# Patient Record
Sex: Female | Born: 1980 | Race: White | Hispanic: No | Marital: Married | Smoking: Never smoker
Health system: Southern US, Community
[De-identification: ages and names within clinical notes are randomized; demographics above are authoritative.]

## PROBLEM LIST (undated history)

## (undated) DIAGNOSIS — R51 Headache: Secondary | ICD-10-CM

## (undated) DIAGNOSIS — R12 Heartburn: Secondary | ICD-10-CM

## (undated) DIAGNOSIS — O26899 Other specified pregnancy related conditions, unspecified trimester: Secondary | ICD-10-CM

## (undated) DIAGNOSIS — Z789 Other specified health status: Secondary | ICD-10-CM

---
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
## 2003-02-02 ENCOUNTER — Other Ambulatory Visit: Admission: RE | Admit: 2003-02-02 | Discharge: 2003-02-02 | Payer: Self-pay | Admitting: Obstetrics and Gynecology

## 2003-08-18 ENCOUNTER — Other Ambulatory Visit: Admission: RE | Admit: 2003-08-18 | Discharge: 2003-08-18 | Payer: Self-pay | Admitting: Obstetrics and Gynecology

## 2004-01-12 ENCOUNTER — Other Ambulatory Visit: Admission: RE | Admit: 2004-01-12 | Discharge: 2004-01-12 | Payer: Self-pay | Admitting: Obstetrics and Gynecology

## 2004-05-31 ENCOUNTER — Other Ambulatory Visit: Admission: RE | Admit: 2004-05-31 | Discharge: 2004-05-31 | Payer: Self-pay | Admitting: Obstetrics and Gynecology

## 2004-11-15 ENCOUNTER — Other Ambulatory Visit: Admission: RE | Admit: 2004-11-15 | Discharge: 2004-11-15 | Payer: Self-pay | Admitting: Obstetrics and Gynecology

## 2005-07-07 ENCOUNTER — Emergency Department (HOSPITAL_COMMUNITY): Admission: EM | Admit: 2005-07-07 | Discharge: 2005-07-07 | Payer: Self-pay | Admitting: Family Medicine

## 2005-08-04 ENCOUNTER — Other Ambulatory Visit: Admission: RE | Admit: 2005-08-04 | Discharge: 2005-08-04 | Payer: Self-pay | Admitting: Obstetrics and Gynecology

## 2009-02-06 ENCOUNTER — Inpatient Hospital Stay (HOSPITAL_COMMUNITY): Admission: AD | Admit: 2009-02-06 | Discharge: 2009-02-06 | Payer: Self-pay | Admitting: Obstetrics and Gynecology

## 2009-04-01 ENCOUNTER — Ambulatory Visit (HOSPITAL_COMMUNITY): Admission: RE | Admit: 2009-04-01 | Discharge: 2009-04-01 | Payer: Self-pay | Admitting: Obstetrics and Gynecology

## 2009-05-17 ENCOUNTER — Ambulatory Visit (HOSPITAL_COMMUNITY): Admission: RE | Admit: 2009-05-17 | Discharge: 2009-05-17 | Payer: Self-pay | Admitting: Obstetrics and Gynecology

## 2009-08-26 ENCOUNTER — Inpatient Hospital Stay (HOSPITAL_COMMUNITY): Admission: RE | Admit: 2009-08-26 | Discharge: 2009-08-28 | Payer: Self-pay | Admitting: Obstetrics and Gynecology

## 2009-08-26 ENCOUNTER — Encounter (INDEPENDENT_AMBULATORY_CARE_PROVIDER_SITE_OTHER): Payer: Self-pay | Admitting: Obstetrics and Gynecology

## 2009-09-06 ENCOUNTER — Inpatient Hospital Stay (HOSPITAL_COMMUNITY): Admission: AD | Admit: 2009-09-06 | Discharge: 2009-09-06 | Payer: Self-pay | Admitting: Obstetrics and Gynecology

## 2010-09-12 LAB — CBC
HCT: 31.9 % — ABNORMAL LOW (ref 36.0–46.0)
HCT: 37.3 % (ref 36.0–46.0)
MCHC: 33.5 g/dL (ref 30.0–36.0)
MCV: 95.1 fL (ref 78.0–100.0)
MCV: 95.9 fL (ref 78.0–100.0)
MCV: 95.9 fL (ref 78.0–100.0)
Platelets: 225 10*3/uL (ref 150–400)
Platelets: 304 10*3/uL (ref 150–400)
RBC: 3.33 MIL/uL — ABNORMAL LOW (ref 3.87–5.11)
RBC: 3.89 MIL/uL (ref 3.87–5.11)
RDW: 13.5 % (ref 11.5–15.5)
RDW: 14 % (ref 11.5–15.5)
WBC: 9.4 10*3/uL (ref 4.0–10.5)

## 2010-09-12 LAB — TYPE AND SCREEN: ABO/RH(D): A POS

## 2010-09-12 LAB — ABO/RH: ABO/RH(D): A POS

## 2010-09-12 LAB — RPR: RPR Ser Ql: NONREACTIVE

## 2010-09-24 LAB — GC/CHLAMYDIA PROBE AMP, GENITAL
Chlamydia, DNA Probe: NEGATIVE
GC Probe Amp, Genital: NEGATIVE

## 2010-09-24 LAB — WET PREP, GENITAL

## 2011-05-07 LAB — OB RESULTS CONSOLE ABO/RH

## 2011-05-07 LAB — OB RESULTS CONSOLE GC/CHLAMYDIA: Gonorrhea: NEGATIVE

## 2011-05-07 LAB — OB RESULTS CONSOLE HIV ANTIBODY (ROUTINE TESTING): HIV: NONREACTIVE

## 2011-05-07 LAB — OB RESULTS CONSOLE RUBELLA ANTIBODY, IGM: Rubella: IMMUNE

## 2012-01-24 ENCOUNTER — Encounter (HOSPITAL_COMMUNITY): Payer: Self-pay

## 2012-01-27 ENCOUNTER — Encounter (HOSPITAL_COMMUNITY): Payer: Self-pay | Admitting: Pharmacy Technician

## 2012-01-29 ENCOUNTER — Encounter (HOSPITAL_COMMUNITY)
Admission: RE | Admit: 2012-01-29 | Discharge: 2012-01-29 | Disposition: A | Discharge status: Home / Self Care | Payer: 59 | Source: Ambulatory Visit | Attending: Obstetrics and Gynecology | Admitting: Obstetrics and Gynecology

## 2012-01-29 ENCOUNTER — Encounter (HOSPITAL_COMMUNITY): Payer: Self-pay

## 2012-01-29 HISTORY — DX: Other specified health status: Z78.9

## 2012-01-29 HISTORY — DX: Other specified pregnancy related conditions, unspecified trimester: O26.899

## 2012-01-29 HISTORY — DX: Headache: R51

## 2012-01-29 HISTORY — DX: Heartburn: R12

## 2012-01-29 LAB — CBC
HCT: 37.7 % (ref 36.0–46.0)
Hemoglobin: 12.3 g/dL (ref 12.0–15.0)
MCH: 29.8 pg (ref 26.0–34.0)
Platelets: 181 10*3/uL (ref 150–400)

## 2012-01-29 LAB — SURGICAL PCR SCREEN: Staphylococcus aureus: NEGATIVE

## 2012-01-29 LAB — TYPE AND SCREEN: Antibody Screen: NEGATIVE

## 2012-01-29 NOTE — Patient Instructions (Signed)
Your procedure is scheduled on:02/09/12  Enter through the Main Entrance at :6am Pick up desk phone and dial [phone] and inform us of your arrival.  Please call [phone] if you have any problems the morning of surgery.  Remember: Do not eat after midnight:Thursday Do not drink after:3:30 am Friday- water only  Take these meds the morning of surgery with a sip of water:Prevacid  DO NOT wear jewelry, eye make-up, lipstick,body lotion, or dark fingernail polish. Do not shave for 48 hours prior to surgery.  If you are to be admitted after surgery, leave suitcase in car until your room has been assigned. Patients discharged on the day of surgery will not be allowed to drive home.   Remember to use your Hibiclens as instructed.

## 2012-01-30 LAB — RPR: RPR Ser Ql: NONREACTIVE

## 2012-02-08 NOTE — H&P (Addendum)
Gabrielle Morales is a 31 y.o. female presenting for Repeat C/S and BTL.  Pregnancy has been uncomplicated and she desires repeat with steriliztion History OB History    Grav Para Term Preterm Abortions TAB SAB Ect Mult Living   2         1     Past Medical History  Diagnosis Date  . Heartburn in pregnancy   . Headache     from allergies    . No pertinent past medical history    Past Surgical History  Procedure Date  . Cesarean section    Family History: family history is not on file. Social History:  reports that she has never smoked. She does not have any smokeless tobacco history on file. She reports that she does not drink alcohol or use illicit drugs.   Prenatal Transfer Tool  Maternal Diabetes: No Genetic Screening: Normal Maternal Ultrasounds/Referrals: Normal Fetal Ultrasounds or other Referrals:  None Maternal Substance Abuse:  No Significant Maternal Medications:  None Significant Maternal Lab Results:  None Other Comments:  None  ROS    Last menstrual period 05/08/2011. Exam Physical Exam  Abd Gravid Nt Cx Cl/th/high  Prenatal labs: ABO, Rh: --/--/A POS (08/12 1219) Antibody: NEG (08/12 1219) Rubella: Immune (11/18 0000) RPR: NON REACTIVE (08/12 1219)  HBsAg: Negative (11/18 0000)  HIV: Non-reactive (11/18 0000)  GBS:     Assessment/Plan: IUP at 39 weeks with prev CS for repeat and desires sterilation Risks and benefits of C/S were discussed.  All questions were answered and informed consent was obtained.  Plan to proceed with low segment transverse Cesarean Section. BTL failure rate discussed and she gives informed consent and wishes to procede DL   Vastie Douty C 1/61/0960, 6:46 PM  This patient has been seen and examined.   All of her questions were answered.  Labs and vital signs reviewed.  Informed consent has been obtained.  The History and Physical is current.  02/09/12 0715 DL

## 2012-02-09 ENCOUNTER — Encounter (HOSPITAL_COMMUNITY): Payer: Self-pay | Admitting: Anesthesiology

## 2012-02-09 ENCOUNTER — Encounter (HOSPITAL_COMMUNITY)
Admission: RE | Disposition: A | Discharge status: Home / Self Care | Payer: Self-pay | Source: Ambulatory Visit | Attending: Obstetrics and Gynecology

## 2012-02-09 ENCOUNTER — Encounter (HOSPITAL_COMMUNITY): Payer: Self-pay | Admitting: *Deleted

## 2012-02-09 ENCOUNTER — Inpatient Hospital Stay (HOSPITAL_COMMUNITY): Payer: 59 | Admitting: Anesthesiology

## 2012-02-09 ENCOUNTER — Inpatient Hospital Stay (HOSPITAL_COMMUNITY)
Admission: RE | Admit: 2012-02-09 | Discharge: 2012-02-11 | DRG: 766 | Disposition: A | Discharge status: Home / Self Care | Payer: 59 | Source: Ambulatory Visit | Attending: Obstetrics and Gynecology | Admitting: Obstetrics and Gynecology

## 2012-02-09 DIAGNOSIS — O34219 Maternal care for unspecified type scar from previous cesarean delivery: Principal | ICD-10-CM | POA: Diagnosis present

## 2012-02-09 DIAGNOSIS — Z302 Encounter for sterilization: Secondary | ICD-10-CM

## 2012-02-09 LAB — TYPE AND SCREEN: Antibody Screen: NEGATIVE

## 2012-02-09 SURGERY — Surgical Case
Anesthesia: Spinal | Site: Abdomen | Laterality: Bilateral | Wound class: Clean Contaminated

## 2012-02-09 MED ORDER — IBUPROFEN 600 MG PO TABS
600.0000 mg | ORAL_TABLET | Freq: Four times a day (QID) | ORAL | Status: DC | PRN
  Administered 2012-02-11: 600 mg via ORAL
  Filled 2012-02-09 (×3): qty 1

## 2012-02-09 MED ORDER — SODIUM CHLORIDE 0.9 % IV SOLN
1.0000 ug/kg/h | INTRAVENOUS | Status: DC | PRN
  Filled 2012-02-09: qty 2.5

## 2012-02-09 MED ORDER — ONDANSETRON HCL 4 MG PO TABS: 4.0000 mg | ORAL_TABLET | ORAL | Status: DC | PRN

## 2012-02-09 MED ORDER — OXYTOCIN 10 UNIT/ML IJ SOLN
INTRAMUSCULAR | Status: AC
  Filled 2012-02-09: qty 4

## 2012-02-09 MED ORDER — SCOPOLAMINE 1 MG/3DAYS TD PT72
1.0000 | MEDICATED_PATCH | Freq: Once | TRANSDERMAL | Status: DC
  Administered 2012-02-09: 1.5 mg via TRANSDERMAL

## 2012-02-09 MED ORDER — ONDANSETRON HCL 4 MG/2ML IJ SOLN: 4.0000 mg | Freq: Three times a day (TID) | INTRAMUSCULAR | Status: DC | PRN

## 2012-02-09 MED ORDER — PRENATAL MULTIVITAMIN CH: 1.0000 | ORAL_TABLET | Freq: Every day | ORAL | Status: DC

## 2012-02-09 MED ORDER — NALBUPHINE HCL 10 MG/ML IJ SOLN
5.0000 mg | INTRAMUSCULAR | Status: DC | PRN
  Filled 2012-02-09 (×2): qty 1

## 2012-02-09 MED ORDER — CEFOTETAN DISODIUM 2 G IJ SOLR
2.0000 g | INTRAMUSCULAR | Status: AC
  Administered 2012-02-09: 2 g via INTRAVENOUS
  Filled 2012-02-09: qty 2

## 2012-02-09 MED ORDER — LACTATED RINGERS IV SOLN
INTRAVENOUS | Status: DC
  Administered 2012-02-09 (×2): via INTRAVENOUS

## 2012-02-09 MED ORDER — KETOROLAC TROMETHAMINE 60 MG/2ML IM SOLN
60.0000 mg | Freq: Once | INTRAMUSCULAR | Status: AC | PRN
  Administered 2012-02-09: 60 mg via INTRAMUSCULAR

## 2012-02-09 MED ORDER — SCOPOLAMINE 1 MG/3DAYS TD PT72
MEDICATED_PATCH | TRANSDERMAL | Status: AC
  Administered 2012-02-09: 1.5 mg via TRANSDERMAL
  Filled 2012-02-09: qty 1

## 2012-02-09 MED ORDER — MENTHOL 3 MG MT LOZG: 1.0000 | LOZENGE | OROMUCOSAL | Status: DC | PRN

## 2012-02-09 MED ORDER — PHENYLEPHRINE 40 MCG/ML (10ML) SYRINGE FOR IV PUSH (FOR BLOOD PRESSURE SUPPORT)
PREFILLED_SYRINGE | INTRAVENOUS | Status: AC
  Filled 2012-02-09: qty 5

## 2012-02-09 MED ORDER — ONDANSETRON HCL 4 MG/2ML IJ SOLN
INTRAMUSCULAR | Status: AC
  Filled 2012-02-09: qty 2

## 2012-02-09 MED ORDER — NALBUPHINE HCL 10 MG/ML IJ SOLN
5.0000 mg | INTRAMUSCULAR | Status: DC | PRN
  Administered 2012-02-09: 5 mg via INTRAVENOUS
  Filled 2012-02-09: qty 1

## 2012-02-09 MED ORDER — DIPHENHYDRAMINE HCL 25 MG PO CAPS: 25.0000 mg | ORAL_CAPSULE | Freq: Four times a day (QID) | ORAL | Status: DC | PRN

## 2012-02-09 MED ORDER — PRENATAL MULTIVITAMIN CH
1.0000 | ORAL_TABLET | Freq: Every day | ORAL | Status: DC
  Administered 2012-02-10 – 2012-02-11 (×2): 1 via ORAL
  Filled 2012-02-09 (×2): qty 1

## 2012-02-09 MED ORDER — OXYTOCIN 40 UNITS IN LACTATED RINGERS INFUSION - SIMPLE MED: 62.5000 mL/h | INTRAVENOUS | Status: AC

## 2012-02-09 MED ORDER — LACTATED RINGERS IV SOLN
INTRAVENOUS | Status: DC
  Administered 2012-02-09: 12:00:00 via INTRAVENOUS

## 2012-02-09 MED ORDER — PHENYLEPHRINE HCL 10 MG/ML IJ SOLN
INTRAMUSCULAR | Status: DC | PRN
  Administered 2012-02-09 (×8): 40 ug via INTRAVENOUS

## 2012-02-09 MED ORDER — EPHEDRINE 5 MG/ML INJ
INTRAVENOUS | Status: AC
  Filled 2012-02-09: qty 10

## 2012-02-09 MED ORDER — MORPHINE SULFATE (PF) 0.5 MG/ML IJ SOLN
INTRAMUSCULAR | Status: DC | PRN
  Administered 2012-02-09: .1 mg via INTRATHECAL

## 2012-02-09 MED ORDER — ONDANSETRON HCL 4 MG/2ML IJ SOLN
INTRAMUSCULAR | Status: DC | PRN
  Administered 2012-02-09: 4 mg via INTRAVENOUS

## 2012-02-09 MED ORDER — SIMETHICONE 80 MG PO CHEW: 80.0000 mg | CHEWABLE_TABLET | ORAL | Status: DC | PRN

## 2012-02-09 MED ORDER — FENTANYL CITRATE 0.05 MG/ML IJ SOLN: 25.0000 ug | INTRAMUSCULAR | Status: DC | PRN

## 2012-02-09 MED ORDER — ONDANSETRON HCL 4 MG/2ML IJ SOLN: 4.0000 mg | INTRAMUSCULAR | Status: DC | PRN

## 2012-02-09 MED ORDER — METOCLOPRAMIDE HCL 5 MG/ML IJ SOLN: 10.0000 mg | Freq: Three times a day (TID) | INTRAMUSCULAR | Status: DC | PRN

## 2012-02-09 MED ORDER — SIMETHICONE 80 MG PO CHEW
80.0000 mg | CHEWABLE_TABLET | Freq: Three times a day (TID) | ORAL | Status: DC
  Administered 2012-02-09 – 2012-02-11 (×6): 80 mg via ORAL

## 2012-02-09 MED ORDER — ZOLPIDEM TARTRATE 5 MG PO TABS: 5.0000 mg | ORAL_TABLET | Freq: Every evening | ORAL | Status: DC | PRN

## 2012-02-09 MED ORDER — DIPHENHYDRAMINE HCL 25 MG PO CAPS
25.0000 mg | ORAL_CAPSULE | ORAL | Status: DC | PRN
  Filled 2012-02-09: qty 1

## 2012-02-09 MED ORDER — WITCH HAZEL-GLYCERIN EX PADS: 1.0000 "application " | MEDICATED_PAD | CUTANEOUS | Status: DC | PRN

## 2012-02-09 MED ORDER — HYDROMORPHONE HCL PF 1 MG/ML IJ SOLN
0.5000 mg | Freq: Once | INTRAMUSCULAR | Status: AC
  Administered 2012-02-09: 0.5 mg via INTRAVENOUS
  Filled 2012-02-09: qty 1

## 2012-02-09 MED ORDER — BUPIVACAINE IN DEXTROSE 0.75-8.25 % IT SOLN
INTRATHECAL | Status: DC | PRN
  Administered 2012-02-09: 1.5 mL via INTRATHECAL

## 2012-02-09 MED ORDER — SCOPOLAMINE 1 MG/3DAYS TD PT72: 1.0000 | MEDICATED_PATCH | Freq: Once | TRANSDERMAL | Status: DC

## 2012-02-09 MED ORDER — TETANUS-DIPHTH-ACELL PERTUSSIS 5-2.5-18.5 LF-MCG/0.5 IM SUSP: 0.5000 mL | Freq: Once | INTRAMUSCULAR | Status: DC

## 2012-02-09 MED ORDER — LACTATED RINGERS IV SOLN
INTRAVENOUS | Status: DC | PRN
  Administered 2012-02-09: 08:00:00 via INTRAVENOUS

## 2012-02-09 MED ORDER — DIBUCAINE 1 % RE OINT: 1.0000 "application " | TOPICAL_OINTMENT | RECTAL | Status: DC | PRN

## 2012-02-09 MED ORDER — CEFAZOLIN SODIUM-DEXTROSE 2-3 GM-% IV SOLR
INTRAVENOUS | Status: AC
  Filled 2012-02-09: qty 50

## 2012-02-09 MED ORDER — MORPHINE SULFATE 0.5 MG/ML IJ SOLN
INTRAMUSCULAR | Status: AC
  Filled 2012-02-09: qty 10

## 2012-02-09 MED ORDER — KETOROLAC TROMETHAMINE 60 MG/2ML IM SOLN
INTRAMUSCULAR | Status: AC
  Filled 2012-02-09: qty 2

## 2012-02-09 MED ORDER — OXYCODONE-ACETAMINOPHEN 5-325 MG PO TABS
1.0000 | ORAL_TABLET | ORAL | Status: DC | PRN
  Administered 2012-02-09 – 2012-02-11 (×4): 1 via ORAL
  Filled 2012-02-09 (×4): qty 1

## 2012-02-09 MED ORDER — FENTANYL CITRATE 0.05 MG/ML IJ SOLN
INTRAMUSCULAR | Status: AC
  Filled 2012-02-09: qty 2

## 2012-02-09 MED ORDER — SODIUM CHLORIDE 0.9 % IJ SOLN: 3.0000 mL | INTRAMUSCULAR | Status: DC | PRN

## 2012-02-09 MED ORDER — LANOLIN HYDROUS EX OINT: 1.0000 "application " | TOPICAL_OINTMENT | CUTANEOUS | Status: DC | PRN

## 2012-02-09 MED ORDER — MEPERIDINE HCL 25 MG/ML IJ SOLN: 6.2500 mg | INTRAMUSCULAR | Status: DC | PRN

## 2012-02-09 MED ORDER — FENTANYL CITRATE 0.05 MG/ML IJ SOLN
INTRAMUSCULAR | Status: DC | PRN
  Administered 2012-02-09: 15 ug via INTRATHECAL
  Administered 2012-02-09: 15 ug via INTRAVENOUS

## 2012-02-09 MED ORDER — IBUPROFEN 600 MG PO TABS
600.0000 mg | ORAL_TABLET | Freq: Four times a day (QID) | ORAL | Status: DC
  Administered 2012-02-09 – 2012-02-11 (×6): 600 mg via ORAL
  Filled 2012-02-09 (×4): qty 1

## 2012-02-09 MED ORDER — KETOROLAC TROMETHAMINE 30 MG/ML IJ SOLN: 30.0000 mg | Freq: Four times a day (QID) | INTRAMUSCULAR | Status: AC | PRN

## 2012-02-09 MED ORDER — SENNOSIDES-DOCUSATE SODIUM 8.6-50 MG PO TABS
2.0000 | ORAL_TABLET | Freq: Every day | ORAL | Status: DC
  Administered 2012-02-09 – 2012-02-10 (×2): 2 via ORAL

## 2012-02-09 MED ORDER — NALOXONE HCL 0.4 MG/ML IJ SOLN: 0.4000 mg | INTRAMUSCULAR | Status: DC | PRN

## 2012-02-09 MED ORDER — OXYTOCIN 40 UNITS IN LACTATED RINGERS INFUSION - SIMPLE MED
INTRAVENOUS | Status: DC | PRN
  Administered 2012-02-09: 40 [IU] via INTRAVENOUS

## 2012-02-09 MED ORDER — DIPHENHYDRAMINE HCL 50 MG/ML IJ SOLN: 12.5000 mg | INTRAMUSCULAR | Status: DC | PRN

## 2012-02-09 MED ORDER — DIPHENHYDRAMINE HCL 50 MG/ML IJ SOLN: 25.0000 mg | INTRAMUSCULAR | Status: DC | PRN

## 2012-02-09 SURGICAL SUPPLY — 31 items
CLIP FILSHIE TUBAL LIGA STRL (Clip) ×2 IMPLANT
CLOTH BEACON ORANGE TIMEOUT ST (SAFETY) ×2 IMPLANT
DRESSING TELFA 8X3 (GAUZE/BANDAGES/DRESSINGS) IMPLANT
DRSG COVADERM 4X10 (GAUZE/BANDAGES/DRESSINGS) ×4 IMPLANT
ELECT REM PT RETURN 9FT ADLT (ELECTROSURGICAL) ×2
ELECTRODE REM PT RTRN 9FT ADLT (ELECTROSURGICAL) ×1 IMPLANT
EXTRACTOR VACUUM M CUP 4 TUBE (SUCTIONS) IMPLANT
GAUZE SPONGE 4X4 12PLY STRL LF (GAUZE/BANDAGES/DRESSINGS) IMPLANT
GLOVE BIO SURGEON STRL SZ8 (GLOVE) ×2 IMPLANT
GLOVE BIOGEL PI IND STRL 7.5 (GLOVE) ×1 IMPLANT
GLOVE BIOGEL PI INDICATOR 7.5 (GLOVE) ×1
GLOVE SURG ORTHO 8.0 STRL STRW (GLOVE) ×2 IMPLANT
GLOVE SURG SS PI 7.5 STRL IVOR (GLOVE) ×2 IMPLANT
GOWN PREVENTION PLUS LG XLONG (DISPOSABLE) ×4 IMPLANT
KIT ABG SYR 3ML LUER SLIP (SYRINGE) ×2 IMPLANT
NEEDLE HYPO 25X5/8 SAFETYGLIDE (NEEDLE) ×2 IMPLANT
NS IRRIG 1000ML POUR BTL (IV SOLUTION) ×2 IMPLANT
PACK C SECTION WH (CUSTOM PROCEDURE TRAY) ×2 IMPLANT
PAD ABD 7.5X8 STRL (GAUZE/BANDAGES/DRESSINGS) IMPLANT
PAD OB MATERNITY 4.3X12.25 (PERSONAL CARE ITEMS) ×2 IMPLANT
SLEEVE SCD COMPRESS KNEE MED (MISCELLANEOUS) IMPLANT
STAPLER VISISTAT 35W (STAPLE) ×2 IMPLANT
SUT MNCRL 0 VIOLET CTX 36 (SUTURE) ×3 IMPLANT
SUT MONOCRYL 0 CTX 36 (SUTURE) ×3
SUT PDS AB 1 CT  36 (SUTURE)
SUT PDS AB 1 CT 36 (SUTURE) IMPLANT
SUT VIC AB 1 CTX 36 (SUTURE)
SUT VIC AB 1 CTX36XBRD ANBCTRL (SUTURE) IMPLANT
TOWEL OR 17X24 6PK STRL BLUE (TOWEL DISPOSABLE) ×4 IMPLANT
TRAY FOLEY CATH 14FR (SET/KITS/TRAYS/PACK) ×2 IMPLANT
WATER STERILE IRR 1000ML POUR (IV SOLUTION) IMPLANT

## 2012-02-09 NOTE — Addendum Note (Signed)
Addendum  created 02/09/12 1718 by Algis Greenhouse, CRNA   Modules edited:Notes Section

## 2012-02-09 NOTE — Transfer of Care (Signed)
Immediate Anesthesia Transfer of Care Note  Patient: Gabrielle Morales  Procedure(s) Performed: Procedure(s) (LRB): CESAREAN SECTION WITH BILATERAL TUBAL LIGATION (Bilateral)  Patient Location: PACU  Anesthesia Type: Spinal  Level of Consciousness: awake, alert  and oriented  Airway & Oxygen Therapy: Patient Spontanous Breathing  Post-op Assessment: Report given to PACU RN and Post -op Vital signs reviewed and stable  Post vital signs: Reviewed and stable  Complications: No apparent anesthesia complications

## 2012-02-09 NOTE — Anesthesia Postprocedure Evaluation (Signed)
Anesthesia Post Note  Patient: Gabrielle Morales  Procedure(s) Performed: Procedure(s) (LRB): CESAREAN SECTION WITH BILATERAL TUBAL LIGATION (Bilateral)  Anesthesia type: Spinal  Patient location: PACU  Post pain: Pain level controlled  Post assessment: Post-op Vital signs reviewed  Last Vitals:  Filed Vitals:   02/09/12 0930  BP: 105/62  Pulse: 74  Temp: 36.4 C  Resp: 17    Post vital signs: Reviewed  Level of consciousness: awake  Complications: No apparent anesthesia complications

## 2012-02-09 NOTE — Anesthesia Postprocedure Evaluation (Signed)
  Anesthesia Post Note  Patient: Gabrielle Morales  Procedure(s) Performed: Procedure(s) (LRB): CESAREAN SECTION WITH BILATERAL TUBAL LIGATION (Bilateral)  Anesthesia type: Spinal  Patient location: Mother/Baby  Post pain: Pain level controlled  Post assessment: Post-op Vital signs reviewed  Last Vitals:  Filed Vitals:   02/09/12 1552  BP: 100/64  Pulse: 89  Temp: 36.7 C  Resp: 18    Post vital signs: Reviewed  Level of consciousness: awake  Complications: No apparent anesthesia complications

## 2012-02-09 NOTE — Anesthesia Preprocedure Evaluation (Signed)
Anesthesia Evaluation  Patient identified by MRN, date of birth, ID band Patient awake    Reviewed: Allergy & Precautions, H&P , NPO status , Patient's Chart, lab work & pertinent test results  Airway Mallampati: I TM Distance: >3 FB Neck ROM: full    Dental No notable dental hx.    Pulmonary neg pulmonary ROS,  breath sounds clear to auscultation  Pulmonary exam normal       Cardiovascular negative cardio ROS      Neuro/Psych negative psych ROS   GI/Hepatic negative GI ROS, Neg liver ROS,   Endo/Other  negative endocrine ROS  Renal/GU negative Renal ROS  negative genitourinary   Musculoskeletal negative musculoskeletal ROS (+)   Abdominal Normal abdominal exam  (+)   Peds negative pediatric ROS (+)  Hematology negative hematology ROS (+)   Anesthesia Other Findings   Reproductive/Obstetrics (+) Pregnancy                           Anesthesia Physical Anesthesia Plan  ASA: II  Anesthesia Plan: Spinal   Post-op Pain Management:    Induction:   Airway Management Planned:   Additional Equipment:   Intra-op Plan:   Post-operative Plan:   Informed Consent: I have reviewed the patients History and Physical, chart, labs and discussed the procedure including the risks, benefits and alternatives for the proposed anesthesia with the patient or authorized representative who has indicated his/her understanding and acceptance.     Plan Discussed with: Surgeon and CRNA  Anesthesia Plan Comments:         Anesthesia Quick Evaluation

## 2012-02-09 NOTE — Op Note (Signed)
Cesarean Section Procedure Note  Pre-operative Diagnosis: IUP at 39 weeks, prev C/S desires repeat, Multiparity desires sterility  Post-operative Diagnosis: same  Surgeon: Turner Daniels   Assistants: none  Anesthesia:Spinal  Procedure:  Low Segment Transverse cesarean section, BTL with Filshie clips  Procedure Details  The patient was seen in the Holding Room. The risks, benefits, complications, treatment options, and expected outcomes were discussed with the patient.  The patient concurred with the proposed plan, giving informed consent.  The site of surgery properly noted/marked.. A Time Out was held and the above information confirmed.  After induction of anesthesia, the patient was draped and prepped in the usual sterile manner. A Pfannenstiel incision was made and carried down through the subcutaneous tissue to the fascia. Fascial incision was made and extended transversely. The fascia was separated from the underlying rectus tissue superiorly and inferiorly. The peritoneum was identified and entered. Peritoneal incision was extended longitudinally. The utero-vesical peritoneal reflection was incised transversely and the bladder flap was bluntly freed from the lower uterine segment. A low transverse uterine incision was made. Delivered from Vertex presentation was a baby with Apgar scores of 9 at one minute and 9 at five minutes. After the umbilical cord was clamped and cut cord blood was obtained for evaluation. The placenta was removed intact and appeared normal. The uterine outline, tubes and ovaries appeared normal. The uterine incision was closed with running locked sutures of 0 monocryl and imbricated with 0 monocryl. Hemostasis was observed. Lavage was carried out until clear.  Both fallopian tubes were identified by their fimbriated ends and a filshie clip was placed at the mid portion of the fallopian tubes with good placement noted across the entire tubes.   The peritoneum was then  closed with 0 monocryl and rectus muscles plicated in the midline.  After hemostasis was assured, the fascia was then reapproximated with running sutures of 0 Vicryl. Irrigation was applied and after adequate hemostasis was assured, the skin was reapproximated with staples.  Instrument, sponge, and needle counts were correct prior the abdominal closure and at the conclusion of the case. The patient received 2 grams cefotetan preoperatively.  Findings: Viable female, ph art pending  Estimated Blood Loss:  400cc         Specimens: Placenta was sent to L&D, CCB done         Complications:  None

## 2012-02-09 NOTE — Anesthesia Procedure Notes (Signed)
Spinal  Patient location during procedure: OR Start time: 02/09/2012 7:36 AM Staffing Performed by: anesthesiologist  Preanesthetic Checklist Completed: patient identified, site marked, surgical consent, pre-op evaluation, timeout performed, IV checked, risks and benefits discussed and monitors and equipment checked Spinal Block Patient position: sitting Prep: site prepped and draped and DuraPrep Patient monitoring: heart rate, cardiac monitor, continuous pulse ox and blood pressure Approach: midline Location: L3-4 Injection technique: single-shot Needle Needle type: Sprotte  Needle gauge: 24 G Needle length: 9 cm Assessment Sensory level: T4 Additional Notes Clear free flow CSF on first attempt.  Transient right paresthesia.  Patient tolerated procedure well.  Jasmine December, MD

## 2012-02-10 LAB — CBC
MCH: 30.1 pg (ref 26.0–34.0)
MCHC: 32.7 g/dL (ref 30.0–36.0)
MCV: 92 fL (ref 78.0–100.0)
Platelets: 174 10*3/uL (ref 150–400)
RDW: 13.7 % (ref 11.5–15.5)

## 2012-02-10 NOTE — Progress Notes (Signed)
Subjective: Postpartum Day 1: Cesarean Delivery Patient reports tolerating PO, + flatus, + BM and no problems voiding.  Requests circumcision today.  Objective: Vital signs in last 24 hours: Temp:  [97.2 F (36.2 C)-98.9 F (37.2 C)] 97.4 F (36.3 C) (08/24 0555) Pulse Rate:  [51-99] 79  (08/24 0555) Resp:  [13-28] 18  (08/24 0555) BP: (90-113)/(54-73) 104/59 mmHg (08/24 0555) SpO2:  [96 %-99 %] 98 % (08/24 0100)  Physical Exam:  General: alert, cooperative and appears stated age Lochia: appropriate Uterine Fundus: firm Incision: healing well, no significant drainage.  Staples in place. DVT Evaluation: No evidence of DVT seen on physical exam. Negative Homan's sign. No cords or calf tenderness. No significant calf/ankle edema.   Basename 02/10/12 0500  HGB 10.1*  HCT 30.9*    Assessment/Plan: Status post Cesarean section. Doing well postoperatively.  Continue current care. Requests early discharge tomorrow. Baby for circ this AM.  Jull Harral 02/10/2012, 8:43 AM

## 2012-02-11 MED ORDER — IBUPROFEN 600 MG PO TABS: 600.0000 mg | ORAL_TABLET | Freq: Four times a day (QID) | ORAL | Status: AC | PRN

## 2012-02-11 MED ORDER — OXYCODONE-ACETAMINOPHEN 5-325 MG PO TABS: 1.0000 | ORAL_TABLET | ORAL | Status: AC | PRN

## 2012-02-11 NOTE — Progress Notes (Signed)
Pt requesting infant to receive bottle at this time. Nursery made aware.

## 2012-02-11 NOTE — Discharge Summary (Signed)
Obstetric Discharge Summary Reason for Admission: cesarean section.  Requests early discharge.  Circumcision performed yesterday. Prenatal Procedures: none Intrapartum Procedures: cesarean: low cervical, transverse Postpartum Procedures: none Complications-Operative and Postpartum: none Hemoglobin  Date Value Range Status  02/10/2012 10.1* 12.0 - 15.0 g/dL Final     HCT  Date Value Range Status  02/10/2012 30.9* 36.0 - 46.0 % Final    Physical Exam:  General: alert, cooperative and appears stated age 31: appropriate Uterine Fundus: firm Incision: healing well, no significant drainage, no dehiscence, no significant erythema DVT Evaluation: No evidence of DVT seen on physical exam. Negative Homan's sign. No cords or calf tenderness. No significant calf/ankle edema.  Discharge Diagnoses: Term Pregnancy-delivered  Discharge Information: Date: 02/11/2012 Activity: unrestricted and pelvic rest Diet: routine Medications: PNV, Ibuprofen, Colace and Percocet Condition: stable Instructions: refer to practice specific booklet Discharge to: home   Newborn Data: Live born female  Birth Weight: 8 lb 8.7 oz (3875 g) APGAR: 9, 9  Home with mother.  Gabrielle Morales 02/11/2012, 9:24 AM

## 2012-02-16 ENCOUNTER — Inpatient Hospital Stay (HOSPITAL_COMMUNITY): Admission: AD | Admit: 2012-02-16 | Payer: Self-pay | Source: Ambulatory Visit | Admitting: Obstetrics and Gynecology

## 2013-06-18 ENCOUNTER — Other Ambulatory Visit (HOSPITAL_COMMUNITY): Payer: Self-pay | Admitting: Emergency Medicine

## 2013-06-18 ENCOUNTER — Ambulatory Visit (HOSPITAL_COMMUNITY)
Admission: RE | Admit: 2013-06-18 | Discharge: 2013-06-18 | Disposition: A | Discharge status: Home / Self Care | Payer: 59 | Source: Ambulatory Visit | Attending: Emergency Medicine | Admitting: Emergency Medicine

## 2013-06-18 DIAGNOSIS — R059 Cough, unspecified: Secondary | ICD-10-CM | POA: Insufficient documentation

## 2013-06-18 DIAGNOSIS — R52 Pain, unspecified: Secondary | ICD-10-CM

## 2013-06-18 DIAGNOSIS — R05 Cough: Secondary | ICD-10-CM | POA: Insufficient documentation

## 2013-06-18 DIAGNOSIS — R0789 Other chest pain: Secondary | ICD-10-CM | POA: Insufficient documentation

## 2014-04-20 ENCOUNTER — Encounter (HOSPITAL_COMMUNITY): Payer: Self-pay | Admitting: *Deleted

## 2014-04-24 ENCOUNTER — Other Ambulatory Visit: Payer: Self-pay | Admitting: Obstetrics and Gynecology

## 2014-04-24 DIAGNOSIS — N644 Mastodynia: Secondary | ICD-10-CM

## 2014-05-06 ENCOUNTER — Other Ambulatory Visit: Payer: 59

## 2014-05-07 ENCOUNTER — Ambulatory Visit
Admission: RE | Admit: 2014-05-07 | Discharge: 2014-05-07 | Disposition: A | Discharge status: Home / Self Care | Payer: 59 | Source: Ambulatory Visit | Attending: Obstetrics and Gynecology | Admitting: Obstetrics and Gynecology

## 2014-05-07 DIAGNOSIS — N644 Mastodynia: Secondary | ICD-10-CM

## 2014-05-26 ENCOUNTER — Other Ambulatory Visit: Payer: Self-pay | Admitting: Obstetrics and Gynecology

## 2014-05-27 LAB — CYTOLOGY - PAP

## 2015-01-15 ENCOUNTER — Other Ambulatory Visit: Payer: Self-pay | Admitting: Obstetrics and Gynecology

## 2015-06-29 DIAGNOSIS — Z01419 Encounter for gynecological examination (general) (routine) without abnormal findings: Secondary | ICD-10-CM | POA: Diagnosis not present

## 2015-06-29 DIAGNOSIS — Z6825 Body mass index (BMI) 25.0-25.9, adult: Secondary | ICD-10-CM | POA: Diagnosis not present

## 2015-07-02 DIAGNOSIS — Z13228 Encounter for screening for other metabolic disorders: Secondary | ICD-10-CM | POA: Diagnosis not present

## 2015-07-02 DIAGNOSIS — Z1329 Encounter for screening for other suspected endocrine disorder: Secondary | ICD-10-CM | POA: Diagnosis not present

## 2015-07-02 DIAGNOSIS — Z13220 Encounter for screening for lipoid disorders: Secondary | ICD-10-CM | POA: Diagnosis not present

## 2015-07-02 DIAGNOSIS — Z131 Encounter for screening for diabetes mellitus: Secondary | ICD-10-CM | POA: Diagnosis not present

## 2015-07-02 DIAGNOSIS — Z1321 Encounter for screening for nutritional disorder: Secondary | ICD-10-CM | POA: Diagnosis not present

## 2016-06-29 DIAGNOSIS — Z01419 Encounter for gynecological examination (general) (routine) without abnormal findings: Secondary | ICD-10-CM | POA: Diagnosis not present

## 2016-06-29 DIAGNOSIS — Z6825 Body mass index (BMI) 25.0-25.9, adult: Secondary | ICD-10-CM | POA: Diagnosis not present

## 2016-11-18 IMAGING — MG MM DIAGNOSTIC BILATERAL
4 series · 4 of 4 positions shown · non-contrast
Comparison: None.

CLINICAL DATA: Left breast pain

EXAM:
DIGITAL DIAGNOSTIC  BILATERAL MAMMOGRAM WITH CAD
ULTRASOUND BILATERAL BREAST

[R CC]
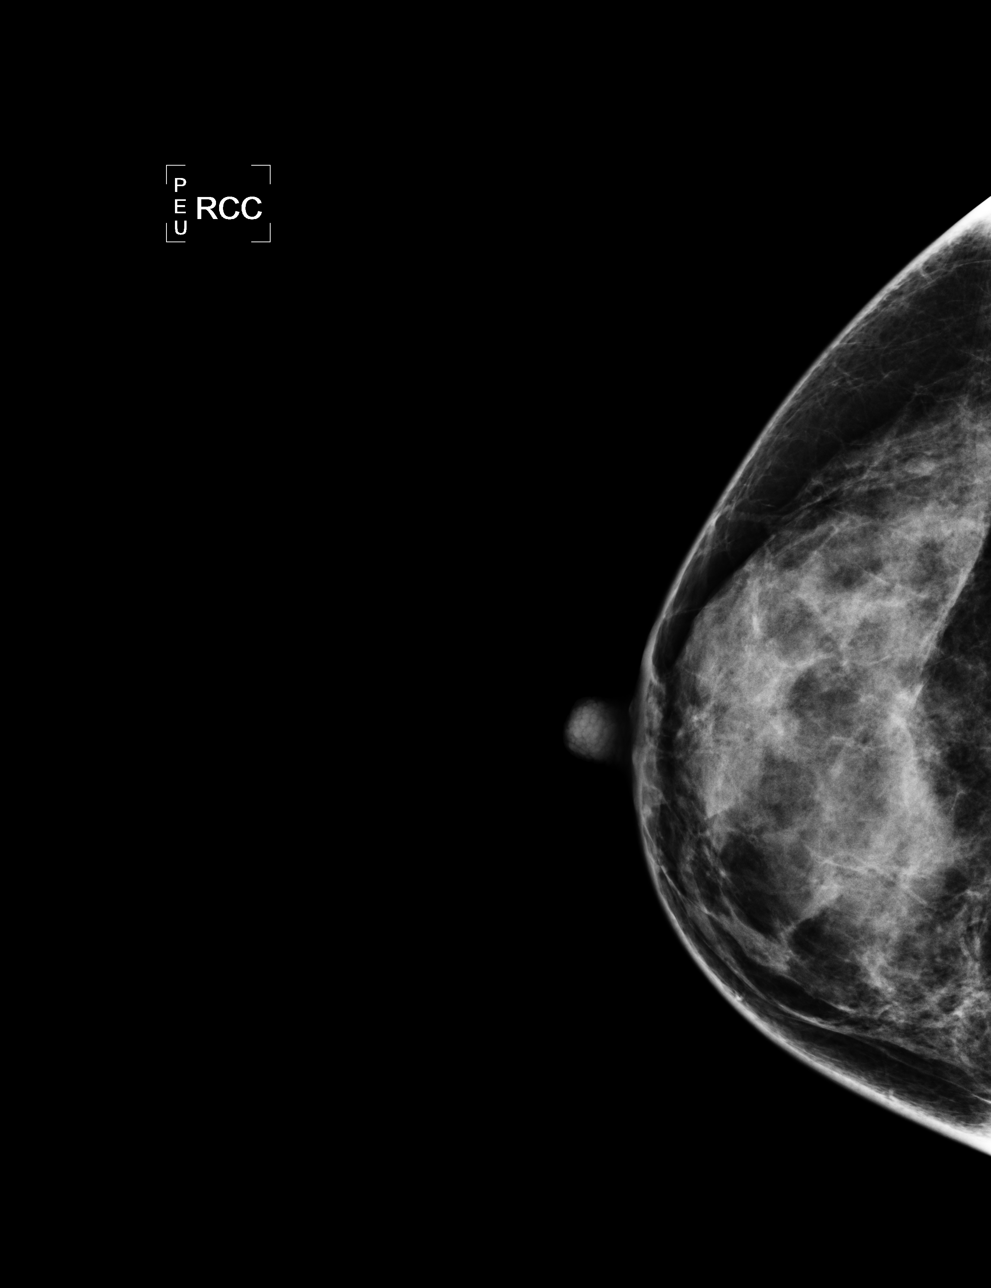

[L CC]
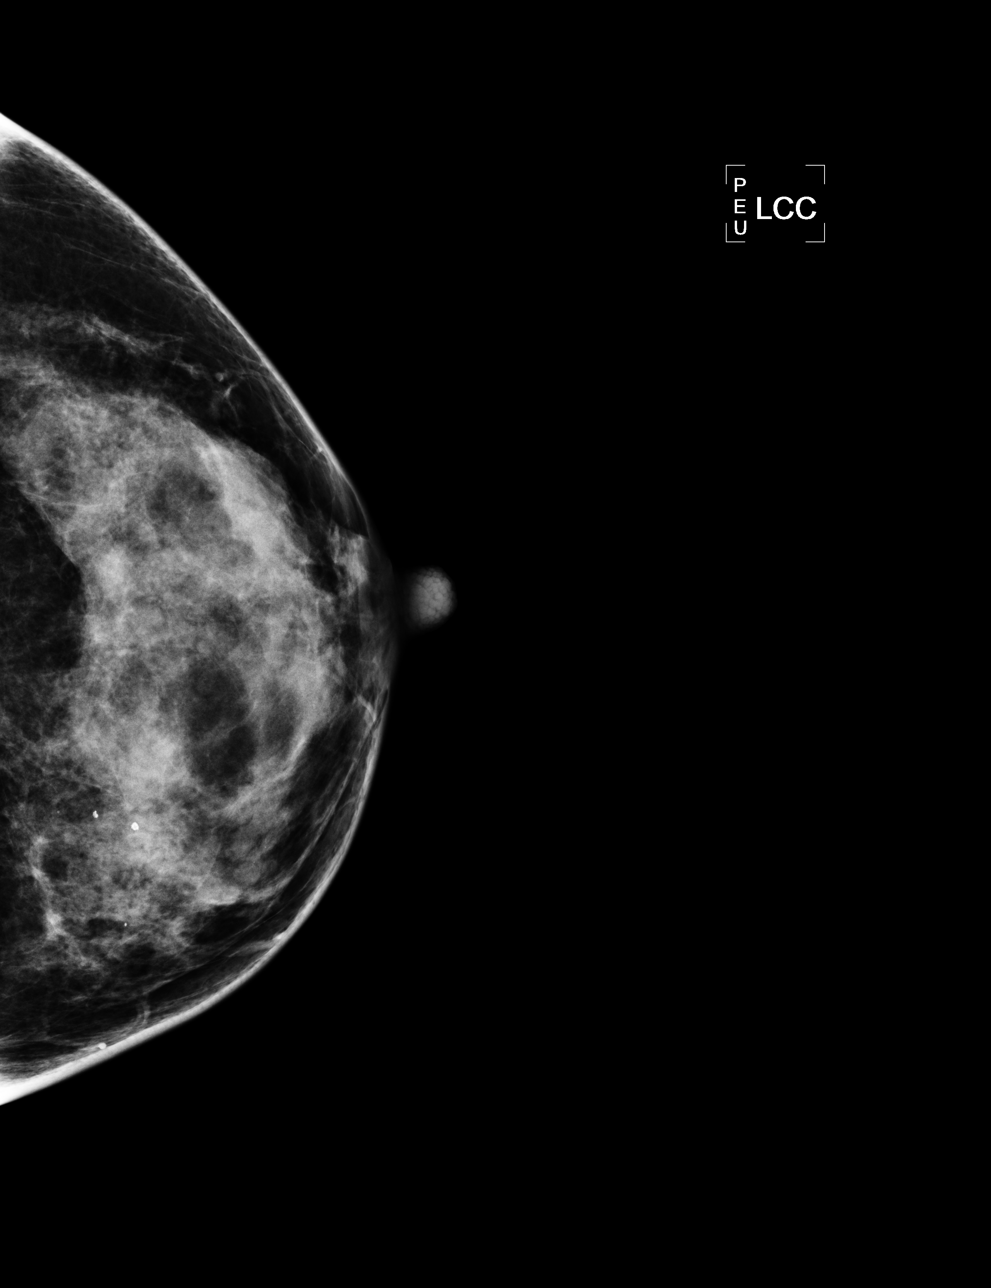

[L MLO]
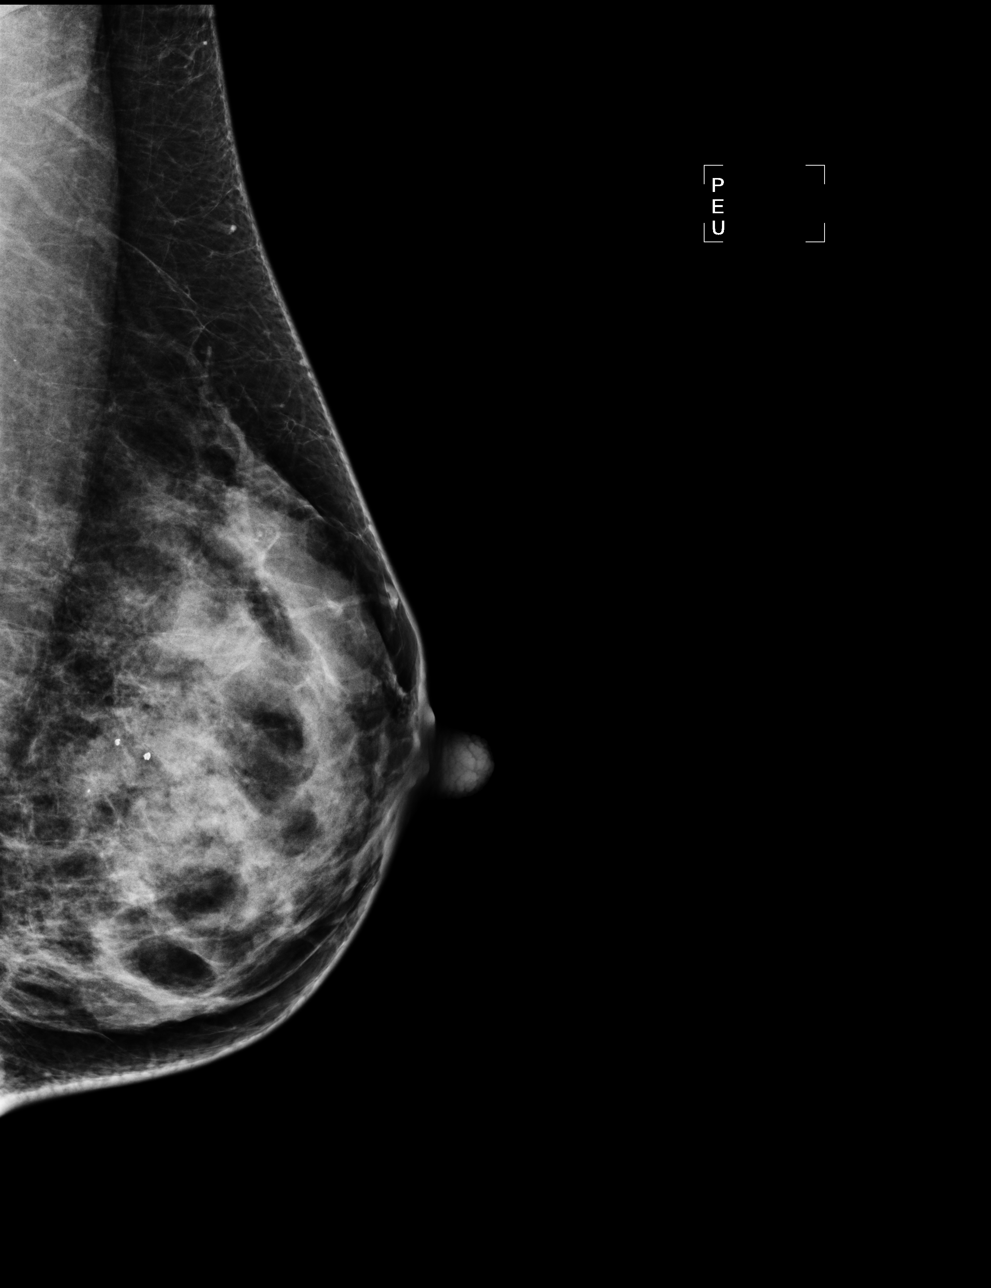

[R MLO]
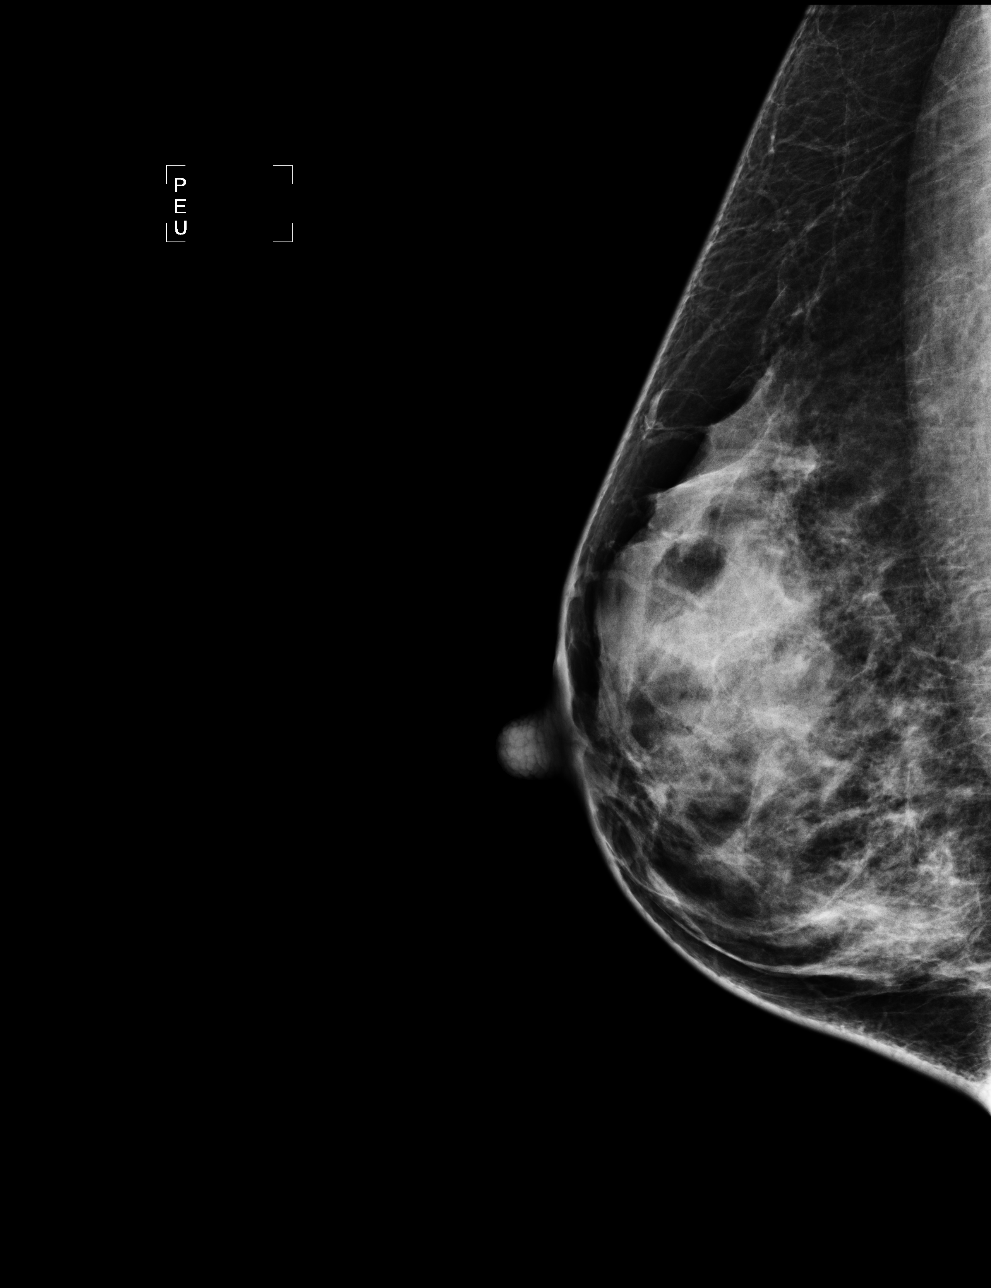

[4 of 4 positions shown; findings below may reference images not displayed]

ACR Breast Density Category d: The breast tissue is extremely dense,
which lowers the sensitivity of mammography.
FINDINGS: The patient reports pain in the medial and lateral left breast. The
breasts are without suspicious mass, architectural distortion or
malignant type calcifications.

Mammographic images were processed with CAD.

On physical exam, no suspicious mass is palpated. Dense breast
tissue is present.

Ultrasound is performed, showing 2 small cysts in the 9 o'clock
position of the left breast, 3 cm from the nipple, measuring 0.4 x
0.5 x 0.3 cm and 0.3 x 0.4 x 0.2 cm, corresponding to focal pain. In
the 3 o'clock position of the left breast, also at a second site of
pain, there is no suspicious mass, shadowing or fluid collection.
IMPRESSION: No mammographic or sonographic evidence of malignancy. A few small
left breast cysts are present.

RECOMMENDATION:
Recommend annual screening mammograms at the age of 40. The patient
should return sooner if clinically indicated.

I have discussed the findings and recommendations with the patient.
Results were also provided in writing at the conclusion of the
visit. If applicable, a reminder letter will be sent to the patient
regarding the next appointment.

BI-RADS CATEGORY  2: Benign.

## 2016-12-07 MED FILL — IBUPROFEN 800 MG TABLET: 800 | 10 days supply | Qty: 30 | Fill #0

## 2016-12-07 MED FILL — HYDROCODON-APAP 7.5-325: 7.5-325 | 5 days supply | Qty: 20 | Fill #0

## 2017-03-22 ENCOUNTER — Ambulatory Visit (INDEPENDENT_AMBULATORY_CARE_PROVIDER_SITE_OTHER): Payer: Self-pay | Admitting: Family Medicine

## 2017-03-22 VITALS — BP 116/74 | HR 103 | Temp 98.4°F | Resp 17 | Wt 145.8 lb

## 2017-03-22 DIAGNOSIS — H109 Unspecified conjunctivitis: Secondary | ICD-10-CM

## 2017-03-22 DIAGNOSIS — B9689 Other specified bacterial agents as the cause of diseases classified elsewhere: Secondary | ICD-10-CM

## 2017-03-22 MED ORDER — ERYTHROMYCIN 5 MG/GM OP OINT: 1.0000 "application " | TOPICAL_OINTMENT | Freq: Two times a day (BID) | OPHTHALMIC | 0 refills | Status: AC

## 2017-03-22 MED FILL — ERYTHROMYCIN EYE OINTMENT: 5 | 10 days supply | Qty: 4 | Fill #0

## 2017-03-22 NOTE — Progress Notes (Signed)
Subjective:    Gabrielle Morales is a 36 y.o. female who presents for evaluation of discharge, erythema, itching and tearing in both eyes. She has noticed the above symptoms for over the last 24 hours.  . Patient denies foreign body sensation, pain and visual field deficit. There is a history of 2 week exposure to conjunctivitis as her children were infected over two weeks ago.  Review of Systems Eyes: positive for irritation and redness. Ears, nose, mouth, throat, and face: positive for sore throat and resolved today.   Objective:    BP 116/74 (BP Location: Right Arm, Patient Position: Sitting, Cuff Size: Normal)   Pulse (!) 103   Temp 98.4 F (36.9 C) (Oral)   Resp 17   Wt 145 lb 12.8 oz (66.1 kg)   SpO2 99%   BMI 25.03 kg/m       General: alert, cooperative and no distress  Eyes:  positive findings: conjunctiva: discolored exudate, erythema, and tenderness      Assessment:    Acute conjunctivitis  Plan:    Discussed the diagnosis and proper care of conjunctivitis.  Stressed household Presenter, broadcasting.   Meds ordered this encounter  Medications  . erythromycin ophthalmic ointment    Sig: Place 1 application into both eyes 2 (two) times daily.    Dispense:  9.5 g    Refill:  0    Emergency department precaution provided.  Godfrey Pick. Tiburcio Pea, MSN, FNP-C [address]  [place], [place] [address] [phone]

## 2017-03-22 NOTE — Patient Instructions (Signed)
Start erythromycin 1 ribbon in both eyes twice daily for 5 days for infective conjunctivitis  If you develop eye swelling, ocular pain, or diminished vision, go immediately to the ED for further evaluation and treatment.     Bacterial Conjunctivitis Bacterial conjunctivitis is an infection of your conjunctiva. This is the clear membrane that covers the white part of your eye and the inner surface of your eyelid. This condition can make your eye:  Red or pink.  Itchy.  This condition is caused by bacteria. This condition spreads very easily from person to person (is contagious) and from one eye to the other eye. Follow these instructions at home: Medicines  Take or apply your antibiotic medicine as told by your doctor. Do not stop taking or applying the antibiotic even if you start to feel better.  Take or apply over-the-counter and prescription medicines only as told by your doctor.  Do not touch your eyelid with the eye drop bottle or the ointment tube. Managing discomfort  Wipe any fluid from your eye with a warm, wet washcloth or a cotton ball.  Place a cool, clean washcloth on your eye. Do this for 10-20 minutes, 3-4 times per day. General instructions  Do not wear contact lenses until the irritation is gone. Wear glasses until your doctor says it is okay to wear contacts.  Do not wear eye makeup until your symptoms are gone. Throw away any old makeup.  Change or wash your pillowcase every day.  Do not share towels or washcloths with anyone.  Wash your hands often with soap and water. Use paper towels to dry your hands.  Do not touch or rub your eyes.  Do not drive or use heavy machinery if your vision is blurry. Contact a doctor if:  You have a fever.  Your symptoms do not get better after 10 days. Get help right away if:  You have a fever and your symptoms suddenly get worse.  You have very bad pain when you move your eye.  Your face: ? Hurts. ? Is  red. ? Is swollen.  You have sudden loss of vision. This information is not intended to replace advice given to you by your health care provider. Make sure you discuss any questions you have with your health care provider. Document Released: 03/14/2008 Document Revised: 11/11/2015 Document Reviewed: 03/18/2015 Elsevier Interactive Patient Education  [employer].

## 2017-03-24 ENCOUNTER — Telehealth: Payer: Self-pay

## 2017-03-24 NOTE — Telephone Encounter (Signed)
Courtesy call - Patient states eyes are getting a little better - not as much green discharge more clear now. She states she did not go to work yesterday or today due to the swelling - she's still doing cold compress as needed. Advised patient to continue using antibiotic ointment as directed  - if eyes are no better in the next 2-3 days to contact office - she may need a follow up visit.

## 2017-07-02 DIAGNOSIS — Z01419 Encounter for gynecological examination (general) (routine) without abnormal findings: Secondary | ICD-10-CM | POA: Diagnosis not present

## 2017-07-02 DIAGNOSIS — Z6824 Body mass index (BMI) 24.0-24.9, adult: Secondary | ICD-10-CM | POA: Diagnosis not present

## 2017-07-02 MED FILL — BUPROPION HCL XL 150 MG TAB: 150 | 14 days supply | Qty: 14 | Fill #0

## 2017-07-02 MED FILL — BUPROPION HCL XL 300 MG TAB: 300 | 30 days supply | Qty: 30 | Fill #0

## 2017-08-16 MED FILL — BUPROPION HCL XL 300 MG TAB: 300 | 30 days supply | Qty: 30 | Fill #1

## 2017-09-13 MED FILL — BUPROPION HCL XL 300 MG TAB: 300 | 30 days supply | Qty: 30 | Fill #2

## 2017-09-17 MED FILL — AMOX-CLAV 875-125 MG TABLET: 875-125 | 10 days supply | Qty: 20 | Fill #0

## 2017-10-24 MED FILL — BUPROPION HCL XL 300 MG TAB: 300 | 30 days supply | Qty: 30 | Fill #3

## 2017-12-03 MED FILL — BUPROPION HCL XL 300 MG TAB: 300 | 30 days supply | Qty: 30 | Fill #4

## 2018-01-18 MED FILL — BUPROPION HCL XL 300 MG TAB: 300 | 30 days supply | Qty: 30 | Fill #5

## 2018-02-28 MED FILL — BuPROPion HCL ER (XL) 300 M: 300 | 30 days supply | Qty: 30 | Fill #6

## 2018-04-09 MED FILL — BuPROPion HCL ER (XL) 300 M: 300 | 30 days supply | Qty: 30 | Fill #7

## 2018-05-17 MED FILL — buPROPion HCL ER (XL) 300 M: 300 | 30 days supply | Qty: 30 | Fill #8

## 2018-07-04 MED FILL — buPROPion HCL ER (XL) 300 M: 300 | 30 days supply | Qty: 30 | Fill #0

## 2018-07-23 DIAGNOSIS — Z01419 Encounter for gynecological examination (general) (routine) without abnormal findings: Secondary | ICD-10-CM | POA: Diagnosis not present

## 2018-07-23 DIAGNOSIS — Z6823 Body mass index (BMI) 23.0-23.9, adult: Secondary | ICD-10-CM | POA: Diagnosis not present

## 2018-08-13 MED FILL — buPROPion HCL ER (XL) 300 M: 300 | 30 days supply | Qty: 30 | Fill #1 | Status: TO

## 2018-09-19 MED FILL — buPROPion HCL ER (XL) 300 M: 300 | 30 days supply | Qty: 30 | Fill #0

## 2018-10-30 MED FILL — buPROPion HCL ER (XL) 300 M: 300 | 30 days supply | Qty: 30 | Fill #0

## 2018-11-13 MED FILL — valACYclovir HCL 1 GM TABS: 1 | 7 days supply | Qty: 21 | Fill #0

## 2018-12-12 MED FILL — buPROPion HCL ER (XL) 300 M: 300 | 30 days supply | Qty: 30 | Fill #0

## 2019-01-10 MED FILL — buPROPion HCL ER (XL) 300 M: 300 | 30 days supply | Qty: 30 | Fill #1

## 2019-01-28 DIAGNOSIS — D509 Iron deficiency anemia, unspecified: Secondary | ICD-10-CM | POA: Diagnosis not present

## 2019-02-13 MED FILL — CEPHALEXIN 500 MG CAPSULE: 500 | 1 days supply | Qty: 3 | Fill #0

## 2019-02-13 MED FILL — HYDROCODON-APAP 5-325: 5-325 | 2 days supply | Qty: 21 | Fill #0

## 2019-02-17 MED FILL — buPROPion HCL ER (XL) 300 M: 300 | 30 days supply | Qty: 30 | Fill #2

## 2019-03-12 DIAGNOSIS — H5213 Myopia, bilateral: Secondary | ICD-10-CM | POA: Diagnosis not present

## 2019-03-27 MED FILL — buPROPion HCL ER (XL) 300 M: 300 | 30 days supply | Qty: 30 | Fill #3

## 2019-04-28 MED FILL — buPROPion HCL ER (XL) 300 M: 300 | 30 days supply | Qty: 30 | Fill #4

## 2019-05-05 MED FILL — NITROFURANTOIN MONO-MCR 100: 100 | 5 days supply | Qty: 10 | Fill #0

## 2019-06-06 MED FILL — BUPROPION HCL XL 300 MG TAB: 300 | 30 days supply | Qty: 30 | Fill #5

## 2019-07-16 MED FILL — BUPROPION HCL ER (XL) 300 M: 300 | 30 days supply | Qty: 30 | Fill #6

## 2019-07-29 DIAGNOSIS — Z113 Encounter for screening for infections with a predominantly sexual mode of transmission: Secondary | ICD-10-CM | POA: Diagnosis not present

## 2019-07-29 DIAGNOSIS — F411 Generalized anxiety disorder: Secondary | ICD-10-CM | POA: Diagnosis not present

## 2019-07-29 DIAGNOSIS — Z01419 Encounter for gynecological examination (general) (routine) without abnormal findings: Secondary | ICD-10-CM | POA: Diagnosis not present

## 2019-07-29 DIAGNOSIS — Z6824 Body mass index (BMI) 24.0-24.9, adult: Secondary | ICD-10-CM | POA: Diagnosis not present

## 2019-07-30 MED FILL — ALPRAZolam 0.25 MG TABS: 0.25 | 10 days supply | Qty: 30 | Fill #0

## 2019-08-11 DIAGNOSIS — R87612 Low grade squamous intraepithelial lesion on cytologic smear of cervix (LGSIL): Secondary | ICD-10-CM | POA: Diagnosis not present

## 2019-08-11 DIAGNOSIS — N870 Mild cervical dysplasia: Secondary | ICD-10-CM | POA: Diagnosis not present

## 2019-08-19 ENCOUNTER — Other Ambulatory Visit (HOSPITAL_COMMUNITY): Payer: Self-pay | Admitting: Obstetrics and Gynecology

## 2019-08-19 MED FILL — BUPROPION HCL ER (XL) 300 M: 300 | 30 days supply | Qty: 30 | Fill #0

## 2019-09-22 MED FILL — BUPROPION HCL ER (XL) 300 M: 300 | 30 days supply | Qty: 30 | Fill #1

## 2019-12-04 MED FILL — BUPROPION HCL ER (XL) 300 M: 300 | 30 days supply | Qty: 30 | Fill #3

## 2020-01-05 MED FILL — BUPROPION HCL ER (XL) 300 M: 300 | 30 days supply | Qty: 30 | Fill #4

## 2020-02-09 ENCOUNTER — Ambulatory Visit (INDEPENDENT_AMBULATORY_CARE_PROVIDER_SITE_OTHER): Payer: 59

## 2020-02-09 ENCOUNTER — Encounter: Payer: Self-pay | Admitting: Emergency Medicine

## 2020-02-09 ENCOUNTER — Ambulatory Visit
Admission: EM | Admit: 2020-02-09 | Discharge: 2020-02-09 | Disposition: A | Discharge status: Home / Self Care | Payer: 59 | Attending: Family Medicine | Admitting: Family Medicine

## 2020-02-09 ENCOUNTER — Other Ambulatory Visit: Payer: Self-pay

## 2020-02-09 DIAGNOSIS — R000 Tachycardia, unspecified: Secondary | ICD-10-CM

## 2020-02-09 DIAGNOSIS — G933 Postviral fatigue syndrome: Secondary | ICD-10-CM | POA: Diagnosis not present

## 2020-02-09 DIAGNOSIS — G9331 Postviral fatigue syndrome: Secondary | ICD-10-CM

## 2020-02-09 DIAGNOSIS — U071 COVID-19: Secondary | ICD-10-CM | POA: Diagnosis not present

## 2020-02-09 DIAGNOSIS — R05 Cough: Secondary | ICD-10-CM | POA: Diagnosis not present

## 2020-02-09 NOTE — ED Provider Notes (Signed)
Pt  [facility]    CSN: [id number] Arrival date & time: 02/09/20  1101      History   Chief Complaint Chief Complaint  Patient presents with  . Fever    HPI Gabrielle Morales is a 39 y.o. female.   Pt is a 39 year female presents today for fever, cough and not feeling well.  Tested positive for Covid approximate 12 days ago.  Is currently, to quarantine but had mild tachycardia and shortness of breath and was concerned.     Past Medical History:  Diagnosis Date  . Headache(784.0)    from allergies    . Heartburn in pregnancy   . No pertinent past medical history     There are no problems to display for this patient.   Past Surgical History:  Procedure Laterality Date  . CESAREAN SECTION      OB History    Gravida  2   Para  1   Term  1   Preterm      AB      Living  2     SAB      TAB      Ectopic      Multiple      Live Births  1            Home Medications    Prior to Admission medications   Medication Sig Start Date End Date Taking? Authorizing Provider  ferrous sulfate (IRON SUPPLEMENT) 325 (65 FE) MG tablet Take 325 mg by mouth at bedtime.    [provider]  lansoprazole (PREVACID) 15 MG capsule Take 15 mg by mouth daily.    [provider]  Prenatal Vit-Fe Fumarate-FA (PRENATAL MULTIVITAMIN) TABS Take 1 tablet by mouth daily.    [provider]    Family History History reviewed. No pertinent family history.  Social History Social History   Tobacco Use  . Smoking status: Never Smoker  Substance Use Topics  . Alcohol use: No  . Drug use: No     Allergies   Lactose intolerance (gi)   Review of Systems Review of Systems   Physical Exam Triage Vital Signs ED Triage Vitals  Enc Vitals Group     BP 02/09/20 1108 (!) 144/83     Pulse Rate 02/09/20 1108 (!) 104     Resp 02/09/20 1108 18     Temp 02/09/20 1108 98.1 F (36.7 C)     Temp Source 02/09/20 1108 Oral     SpO2  02/09/20 1108 100 %     Weight --      Height --      Head Circumference --      Peak Flow --      Pain Score 02/09/20 1109 4     Pain Loc --      Pain Edu? --      Excl. in GC? --    No data found.  Updated Vital Signs BP (!) 144/83 (BP Location: Right Arm)   Pulse (!) 104   Temp 98.1 F (36.7 C) (Oral)   Resp 18   SpO2 100%   Visual Acuity Right Eye Distance:   Left Eye Distance:   Bilateral Distance:    Right Eye Near:   Left Eye Near:    Bilateral Near:     Physical Exam Vitals and nursing note reviewed.  Constitutional:      General: She is not in acute distress.  Appearance: Normal appearance. She is not ill-appearing, toxic-appearing or diaphoretic.  HENT:     Head: Normocephalic.     Nose: Nose normal.  Eyes:     Conjunctiva/sclera: Conjunctivae normal.  Pulmonary:     Effort: Pulmonary effort is normal.  Musculoskeletal:        General: Normal range of motion.     Cervical back: Normal range of motion.  Skin:    General: Skin is warm and dry.     Findings: No rash.  Neurological:     Mental Status: She is alert.  Psychiatric:        Mood and Affect: Mood normal.      UC Treatments / Results  Labs (all labs ordered are listed, but only abnormal results are displayed) Labs Reviewed - No data to display  EKG   Radiology DG Chest 2 View  Result Date: 02/09/2020 CLINICAL DATA:  Cough, COVID EXAM: CHEST - 2 VIEW COMPARISON:  06/18/2013 FINDINGS: The heart size and mediastinal contours are within normal limits. Both lungs are clear. The visualized skeletal structures are unremarkable. IMPRESSION: No acute abnormality of the lungs. Electronically Signed   By: Lauralyn Primes M.D.   On: 02/09/2020 11:21    Procedures Procedures (including critical care time)  Medications Ordered in UC Medications - No data to display  Initial Impression / Assessment and Plan / UC Course  I have reviewed the triage vital signs and the nursing  notes.  Pertinent labs & imaging results that were available during my care of the patient were reviewed by me and considered in my medical decision making (see chart for details).     Personal syndrome and tachycardia There is likely lingering symptoms from Covid.  Her chest x-ray is normal.  Recommended rest, hydrate and follow-up as needed Final Clinical Impressions(s) / UC Diagnoses   Final diagnoses:  Post viral syndrome  Tachycardia     Discharge Instructions     Your x ray was normal  Rest, hydrate.  Follow up as needed for continued or worsening symptoms     ED Prescriptions    None     PDMP not reviewed this encounter.   Janace Aris, NP 02/09/20 1130

## 2020-02-09 NOTE — Discharge Instructions (Addendum)
Your x ray was normal  Rest, hydrate.  Follow up as needed for continued or worsening symptoms

## 2020-02-09 NOTE — ED Triage Notes (Signed)
Pt here for continued fever and not feeling well after testing positive for covid

## 2020-02-16 MED FILL — buPROPion HCL ER (XL) 300 M: 300 | 30 days supply | Qty: 30 | Fill #5

## 2020-02-17 DIAGNOSIS — R87612 Low grade squamous intraepithelial lesion on cytologic smear of cervix (LGSIL): Secondary | ICD-10-CM | POA: Diagnosis not present

## 2020-03-30 MED FILL — buPROPion HCL ER (XL) 300 M: 300 | 30 days supply | Qty: 30 | Fill #6

## 2020-05-07 MED FILL — buPROPion HCL ER (XL) 300 M: 300 | 30 days supply | Qty: 30 | Fill #7

## 2020-06-15 MED FILL — buPROPion HCL ER (XL) 300 M: 300 | 30 days supply | Qty: 30 | Fill #8

## 2020-07-26 MED FILL — buPROPion HCL ER (XL) 300 M: 300 | 30 days supply | Qty: 30 | Fill #9

## 2020-08-17 DIAGNOSIS — Z1329 Encounter for screening for other suspected endocrine disorder: Secondary | ICD-10-CM | POA: Diagnosis not present

## 2020-08-17 DIAGNOSIS — Z6822 Body mass index (BMI) 22.0-22.9, adult: Secondary | ICD-10-CM | POA: Diagnosis not present

## 2020-08-17 DIAGNOSIS — Z131 Encounter for screening for diabetes mellitus: Secondary | ICD-10-CM | POA: Diagnosis not present

## 2020-08-17 DIAGNOSIS — Z01419 Encounter for gynecological examination (general) (routine) without abnormal findings: Secondary | ICD-10-CM | POA: Diagnosis not present

## 2020-08-17 DIAGNOSIS — Z1321 Encounter for screening for nutritional disorder: Secondary | ICD-10-CM | POA: Diagnosis not present

## 2020-08-17 DIAGNOSIS — Z13228 Encounter for screening for other metabolic disorders: Secondary | ICD-10-CM | POA: Diagnosis not present

## 2020-08-17 DIAGNOSIS — R87612 Low grade squamous intraepithelial lesion on cytologic smear of cervix (LGSIL): Secondary | ICD-10-CM | POA: Diagnosis not present

## 2020-08-30 ENCOUNTER — Other Ambulatory Visit (HOSPITAL_COMMUNITY): Payer: Self-pay | Admitting: Obstetrics and Gynecology

## 2020-10-04 ENCOUNTER — Other Ambulatory Visit (HOSPITAL_COMMUNITY): Payer: Self-pay

## 2020-10-04 MED FILL — Bupropion HCl Tab ER 24HR 300 MG: ORAL | 30 days supply | Qty: 30 | Fill #0 | Status: AC

## 2020-11-03 ENCOUNTER — Other Ambulatory Visit (HOSPITAL_COMMUNITY): Payer: Self-pay

## 2020-11-03 MED FILL — Bupropion HCl Tab ER 24HR 300 MG: ORAL | 30 days supply | Qty: 30 | Fill #1 | Status: AC

## 2020-12-09 ENCOUNTER — Other Ambulatory Visit (HOSPITAL_COMMUNITY): Payer: Self-pay

## 2020-12-09 MED FILL — Bupropion HCl Tab ER 24HR 300 MG: ORAL | 30 days supply | Qty: 30 | Fill #2 | Status: AC

## 2021-01-10 ENCOUNTER — Other Ambulatory Visit (HOSPITAL_COMMUNITY): Payer: Self-pay

## 2021-01-10 MED FILL — Bupropion HCl Tab ER 24HR 300 MG: ORAL | 30 days supply | Qty: 30 | Fill #3 | Status: AC

## 2021-02-14 ENCOUNTER — Other Ambulatory Visit (HOSPITAL_COMMUNITY): Payer: Self-pay

## 2021-02-14 MED FILL — Bupropion HCl Tab ER 24HR 300 MG: ORAL | 30 days supply | Qty: 30 | Fill #4 | Status: AC

## 2021-03-09 DIAGNOSIS — R87612 Low grade squamous intraepithelial lesion on cytologic smear of cervix (LGSIL): Secondary | ICD-10-CM | POA: Diagnosis not present

## 2021-03-22 MED FILL — Bupropion HCl Tab ER 24HR 300 MG: ORAL | 30 days supply | Qty: 30 | Fill #5 | Status: AC

## 2021-03-23 ENCOUNTER — Other Ambulatory Visit (HOSPITAL_COMMUNITY): Payer: Self-pay

## 2021-03-28 ENCOUNTER — Other Ambulatory Visit (HOSPITAL_COMMUNITY): Payer: Self-pay

## 2021-05-03 ENCOUNTER — Other Ambulatory Visit (HOSPITAL_COMMUNITY): Payer: Self-pay

## 2021-05-03 MED FILL — Bupropion HCl Tab ER 24HR 300 MG: ORAL | 30 days supply | Qty: 30 | Fill #6 | Status: AC

## 2021-06-14 MED FILL — Bupropion HCl Tab ER 24HR 300 MG: ORAL | 30 days supply | Qty: 30 | Fill #7 | Status: AC

## 2021-06-15 ENCOUNTER — Other Ambulatory Visit (HOSPITAL_COMMUNITY): Payer: Self-pay

## 2021-06-30 ENCOUNTER — Other Ambulatory Visit (HOSPITAL_COMMUNITY): Payer: Self-pay

## 2021-06-30 MED ORDER — CHLORHEXIDINE GLUCONATE 0.12 % MT SOLN
15.0000 mL | Freq: Two times a day (BID) | OROMUCOSAL | 0 refills | Status: AC
  Filled 2021-06-30: qty 473, 16d supply, fill #0

## 2021-06-30 MED ORDER — AMOXICILLIN 500 MG PO CAPS
ORAL_CAPSULE | ORAL | 1 refills | Status: AC
  Filled 2021-06-30: qty 21, 7d supply, fill #0

## 2021-07-21 ENCOUNTER — Other Ambulatory Visit (HOSPITAL_COMMUNITY): Payer: Self-pay

## 2021-07-21 MED FILL — Bupropion HCl Tab ER 24HR 300 MG: ORAL | 30 days supply | Qty: 30 | Fill #8 | Status: AC

## 2021-08-30 ENCOUNTER — Other Ambulatory Visit (HOSPITAL_COMMUNITY): Payer: Self-pay

## 2021-08-30 MED FILL — Bupropion HCl Tab ER 24HR 300 MG: ORAL | 30 days supply | Qty: 30 | Fill #9 | Status: AC

## 2021-09-14 ENCOUNTER — Other Ambulatory Visit (HOSPITAL_COMMUNITY): Payer: Self-pay

## 2021-09-14 MED ORDER — CLINDAMYCIN HCL 150 MG PO CAPS
150.0000 mg | ORAL_CAPSULE | ORAL | 0 refills | Status: AC
  Filled 2021-09-14: qty 4, 1d supply, fill #0

## 2021-09-15 ENCOUNTER — Other Ambulatory Visit (HOSPITAL_COMMUNITY): Payer: Self-pay

## 2021-09-15 MED ORDER — AMOXICILLIN 500 MG PO CAPS
500.0000 mg | ORAL_CAPSULE | ORAL | 0 refills | Status: AC
  Filled 2021-09-15: qty 15, 5d supply, fill #0

## 2021-09-15 MED ORDER — HYDROCODONE-ACETAMINOPHEN 10-325 MG PO TABS
1.0000 | ORAL_TABLET | Freq: Four times a day (QID) | ORAL | 0 refills | Status: AC
  Filled 2021-09-15: qty 12, 3d supply, fill #0

## 2021-10-04 ENCOUNTER — Other Ambulatory Visit (HOSPITAL_COMMUNITY): Payer: Self-pay

## 2021-10-04 MED ORDER — BUPROPION HCL ER (XL) 300 MG PO TB24
300.0000 mg | ORAL_TABLET | Freq: Every morning | ORAL | 1 refills | Status: AC
  Filled 2021-10-04: qty 30, 30d supply, fill #0

## 2021-10-17 ENCOUNTER — Other Ambulatory Visit (HOSPITAL_COMMUNITY): Payer: Self-pay

## 2021-10-17 DIAGNOSIS — Z124 Encounter for screening for malignant neoplasm of cervix: Secondary | ICD-10-CM | POA: Diagnosis not present

## 2021-10-17 DIAGNOSIS — F411 Generalized anxiety disorder: Secondary | ICD-10-CM | POA: Diagnosis not present

## 2021-10-17 DIAGNOSIS — Z01419 Encounter for gynecological examination (general) (routine) without abnormal findings: Secondary | ICD-10-CM | POA: Diagnosis not present

## 2021-10-17 DIAGNOSIS — Z6825 Body mass index (BMI) 25.0-25.9, adult: Secondary | ICD-10-CM | POA: Diagnosis not present

## 2021-10-17 MED ORDER — BUPROPION HCL ER (XL) 300 MG PO TB24
300.0000 mg | ORAL_TABLET | Freq: Every morning | ORAL | 12 refills | Status: AC
  Filled 2021-10-17: qty 30, 30d supply, fill #0

## 2021-11-21 ENCOUNTER — Other Ambulatory Visit (HOSPITAL_COMMUNITY): Payer: Self-pay

## 2022-08-23 IMAGING — DX DG CHEST 2V
2 series · 2 of 2 positions shown · non-contrast
Comparison: 06/18/2013

CLINICAL DATA: Cough, COVID

EXAM:
CHEST - 2 VIEW

[chest pa]
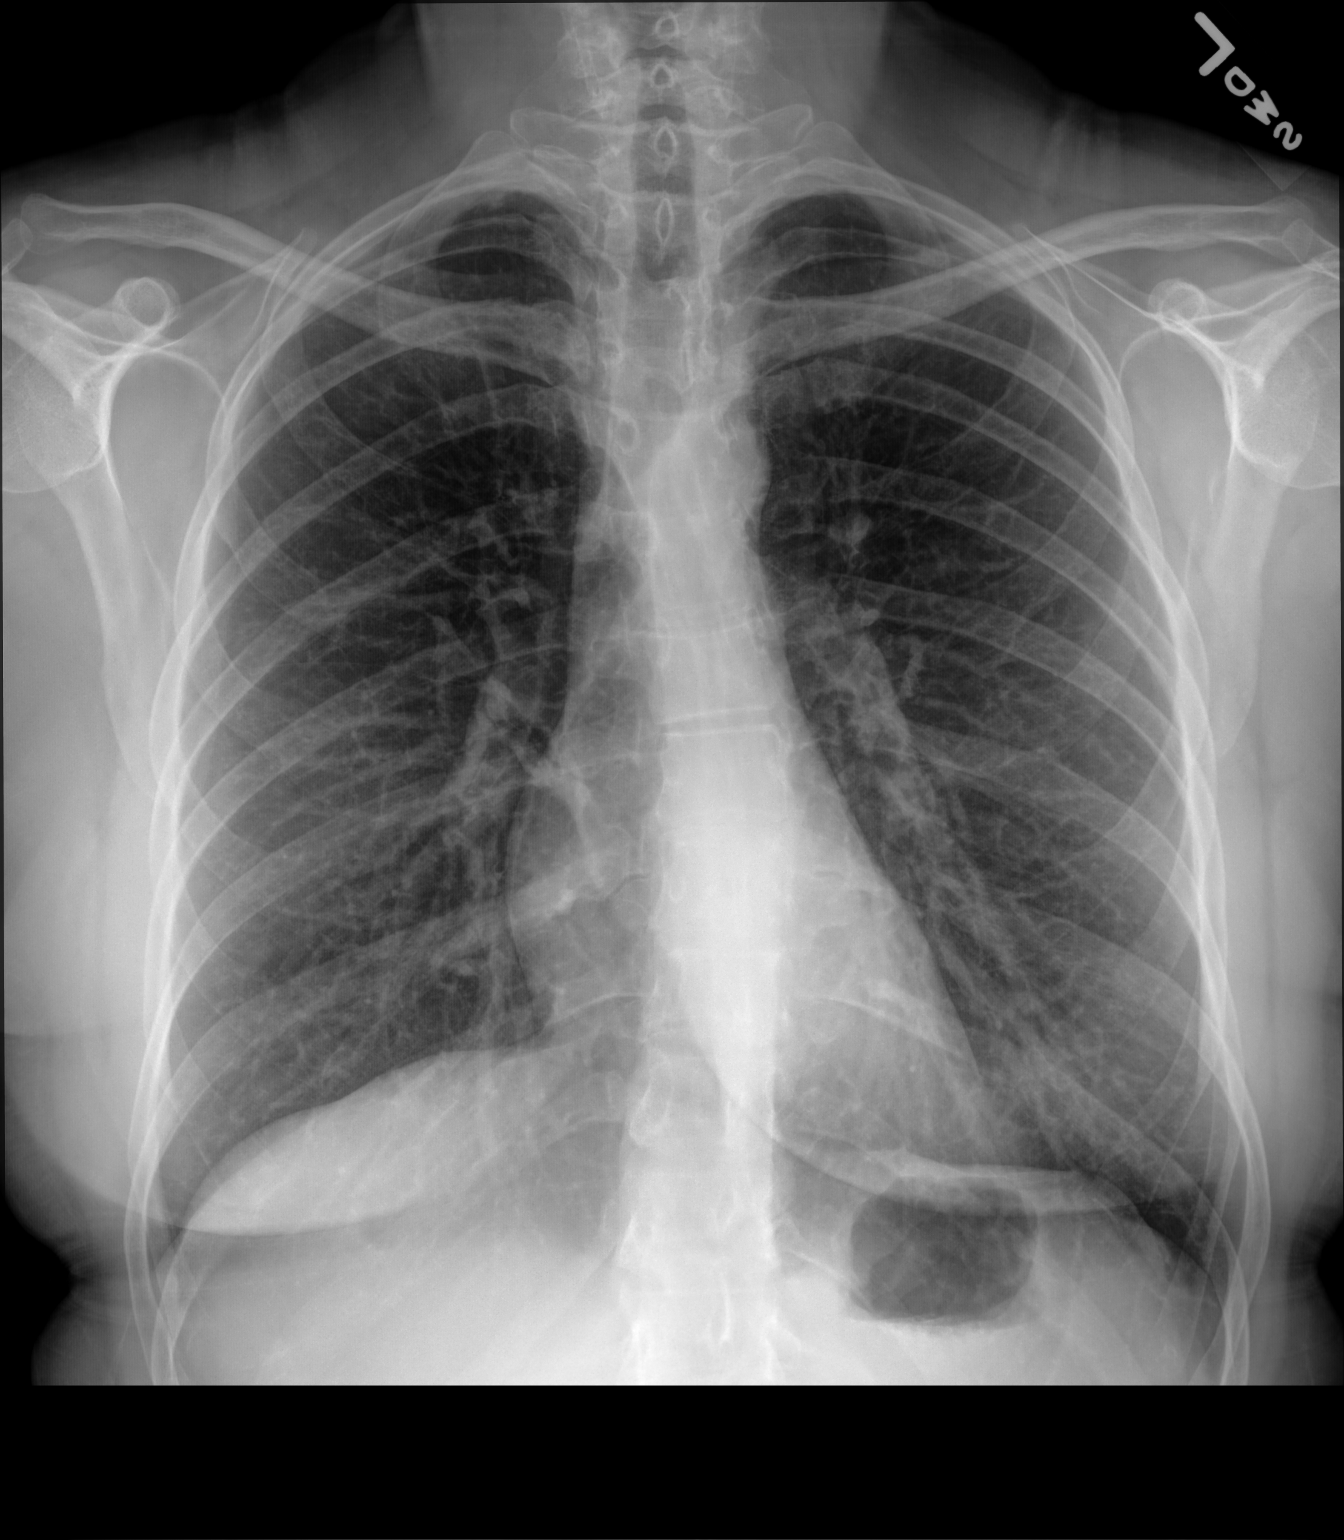

[chest lat]
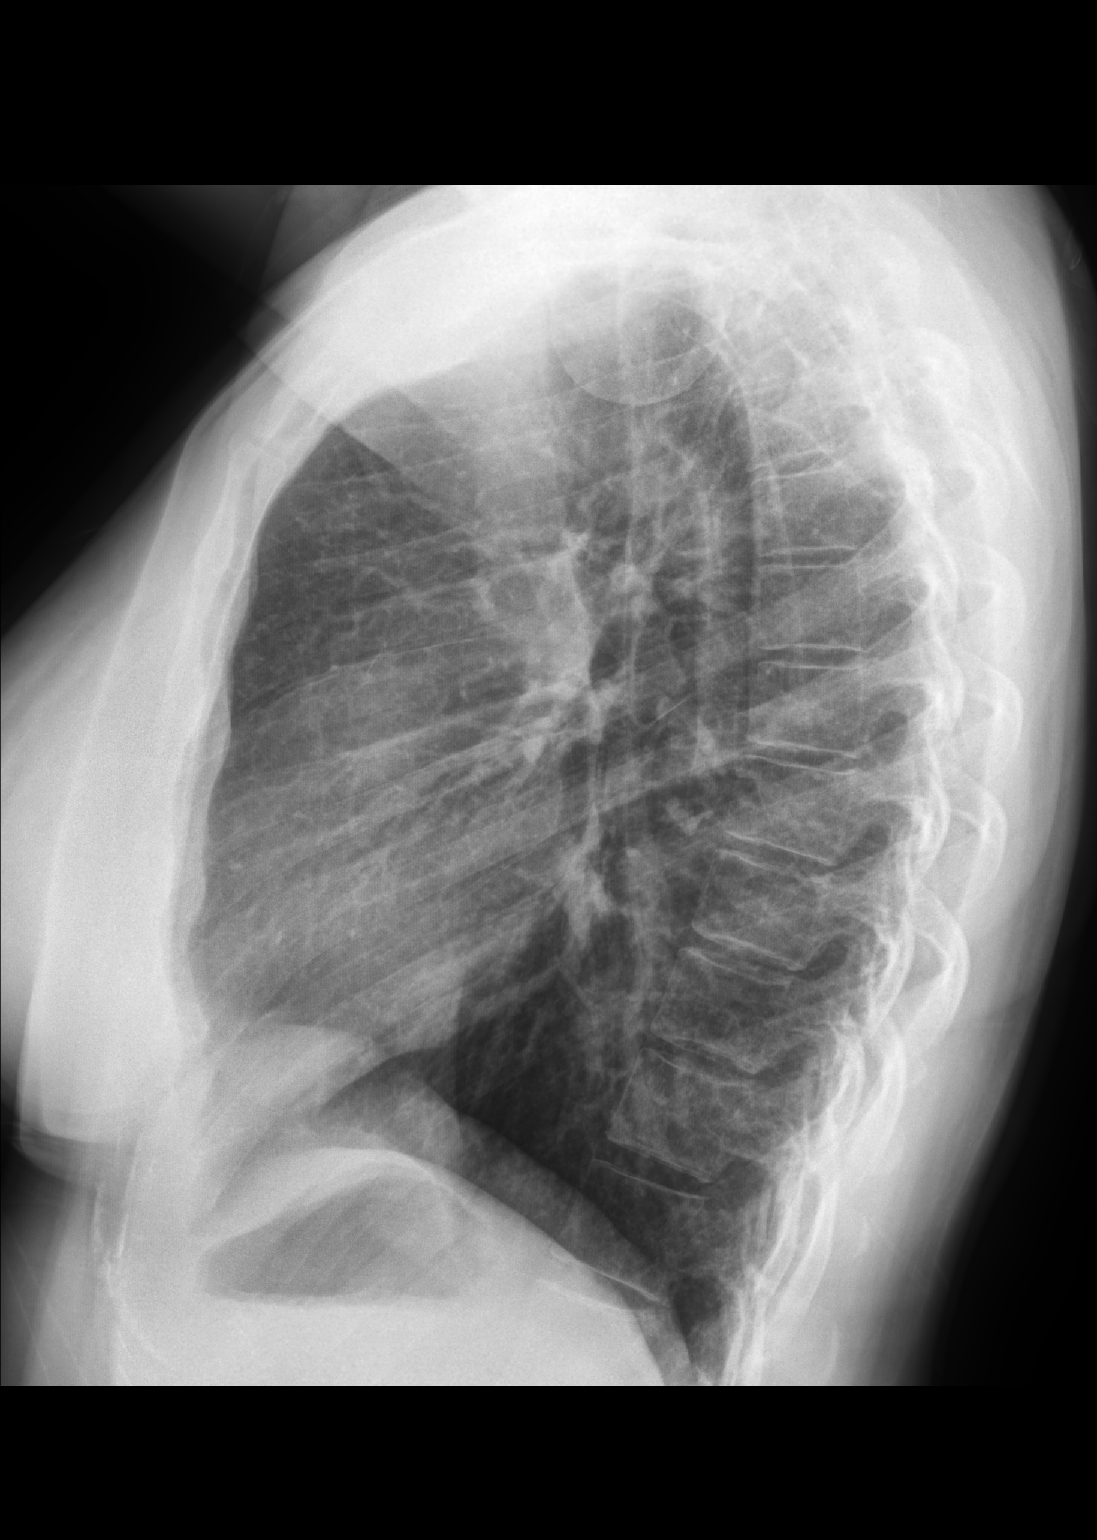

[2 of 2 positions shown; findings below may reference images not displayed]

FINDINGS: The heart size and mediastinal contours are within normal limits.
Both lungs are clear. The visualized skeletal structures are
unremarkable.
IMPRESSION: No acute abnormality of the lungs.
